# Patient Record
Sex: Male | Born: 1975 | Race: White | Hispanic: No | Marital: Single | Smoking: Current every day smoker
Health system: Southern US, Community
[De-identification: ages and names within clinical notes are randomized; demographics above are authoritative.]

## PROBLEM LIST (undated history)

## (undated) HISTORY — PX: OTHER SURGICAL HISTORY: SHX169

## (undated) HISTORY — PX: ELBOW SURGERY: SHX618

---

## 1997-08-18 ENCOUNTER — Emergency Department (HOSPITAL_COMMUNITY): Admission: EM | Admit: 1997-08-18 | Discharge: 1997-08-19 | Payer: Self-pay | Admitting: Emergency Medicine

## 1998-07-04 ENCOUNTER — Emergency Department (HOSPITAL_COMMUNITY): Admission: EM | Admit: 1998-07-04 | Discharge: 1998-07-04 | Payer: Self-pay | Admitting: Emergency Medicine

## 1999-12-06 ENCOUNTER — Emergency Department (HOSPITAL_COMMUNITY): Admission: EM | Admit: 1999-12-06 | Discharge: 1999-12-06 | Payer: Self-pay | Admitting: Emergency Medicine

## 2002-06-16 ENCOUNTER — Emergency Department (HOSPITAL_COMMUNITY): Admission: EM | Admit: 2002-06-16 | Discharge: 2002-06-16 | Payer: Self-pay | Admitting: Emergency Medicine

## 2005-02-26 ENCOUNTER — Emergency Department (HOSPITAL_COMMUNITY): Admission: EM | Admit: 2005-02-26 | Discharge: 2005-02-26 | Payer: Self-pay | Admitting: Emergency Medicine

## 2006-12-11 DIAGNOSIS — Z8709 Personal history of other diseases of the respiratory system: Secondary | ICD-10-CM | POA: Insufficient documentation

## 2010-11-18 ENCOUNTER — Emergency Department (HOSPITAL_COMMUNITY): Payer: No Typology Code available for payment source

## 2010-11-18 ENCOUNTER — Encounter (HOSPITAL_COMMUNITY): Payer: Self-pay | Admitting: Radiology

## 2010-11-18 ENCOUNTER — Emergency Department (HOSPITAL_COMMUNITY)
Admission: EM | Admit: 2010-11-18 | Discharge: 2010-11-19 | Disposition: A | Discharge status: Home / Self Care | Payer: No Typology Code available for payment source | Attending: General Surgery | Admitting: General Surgery

## 2010-11-18 DIAGNOSIS — M25529 Pain in unspecified elbow: Secondary | ICD-10-CM | POA: Insufficient documentation

## 2010-11-18 DIAGNOSIS — IMO0002 Reserved for concepts with insufficient information to code with codable children: Secondary | ICD-10-CM | POA: Insufficient documentation

## 2010-11-18 DIAGNOSIS — M549 Dorsalgia, unspecified: Secondary | ICD-10-CM | POA: Insufficient documentation

## 2010-11-18 DIAGNOSIS — M79609 Pain in unspecified limb: Secondary | ICD-10-CM | POA: Insufficient documentation

## 2010-11-18 DIAGNOSIS — S52599A Other fractures of lower end of unspecified radius, initial encounter for closed fracture: Secondary | ICD-10-CM | POA: Insufficient documentation

## 2010-11-18 DIAGNOSIS — M542 Cervicalgia: Secondary | ICD-10-CM | POA: Insufficient documentation

## 2010-11-18 DIAGNOSIS — M25569 Pain in unspecified knee: Secondary | ICD-10-CM | POA: Insufficient documentation

## 2010-11-18 DIAGNOSIS — R51 Headache: Secondary | ICD-10-CM | POA: Insufficient documentation

## 2010-11-18 DIAGNOSIS — M25539 Pain in unspecified wrist: Secondary | ICD-10-CM | POA: Insufficient documentation

## 2010-11-18 DIAGNOSIS — R5381 Other malaise: Secondary | ICD-10-CM | POA: Insufficient documentation

## 2010-11-18 LAB — POCT I-STAT, CHEM 8
BUN: 19 mg/dL (ref 6–23)
Calcium, Ion: 1.13 mmol/L (ref 1.12–1.32)
Chloride: 107 meq/L (ref 96–112)
Creatinine, Ser: 1.1 mg/dL (ref 0.50–1.35)
Glucose, Bld: 128 mg/dL — ABNORMAL HIGH (ref 70–99)
HCT: 46 % (ref 39.0–52.0)
Hemoglobin: 15.6 g/dL (ref 13.0–17.0)
Potassium: 3.7 meq/L (ref 3.5–5.1)
Sodium: 141 meq/L (ref 135–145)
TCO2: 23 mmol/L (ref 0–100)

## 2010-11-18 LAB — CBC
HCT: 43.5 % (ref 39.0–52.0)
Hemoglobin: 15.2 g/dL (ref 13.0–17.0)
MCH: 30 pg (ref 26.0–34.0)
MCHC: 34.9 g/dL (ref 30.0–36.0)
MCV: 86 fL (ref 78.0–100.0)
Platelets: 348 10*3/uL (ref 150–400)

## 2010-11-18 LAB — DIFFERENTIAL
Basophils Relative: 0 % (ref 0–1)
Eosinophils Absolute: 0.2 10*3/uL (ref 0.0–0.7)
Eosinophils Relative: 2 % (ref 0–5)
Lymphocytes Relative: 30 % (ref 12–46)
Lymphs Abs: 3.8 10*3/uL (ref 0.7–4.0)
Monocytes Absolute: 0.9 10*3/uL (ref 0.1–1.0)
Monocytes Relative: 7 % (ref 3–12)
Neutro Abs: 8 10*3/uL — ABNORMAL HIGH (ref 1.7–7.7)
Neutrophils Relative %: 62 % (ref 43–77)

## 2010-11-18 LAB — ETHANOL: Alcohol, Ethyl (B): <11 mg/dL (ref 0–11)

## 2010-11-18 MED ORDER — IOHEXOL 300 MG/ML  SOLN
80.0000 mL | Freq: Once | INTRAMUSCULAR | Status: AC | PRN
  Administered 2010-11-18: 80 mL via INTRAVENOUS

## 2011-01-19 ENCOUNTER — Telehealth: Payer: Self-pay | Admitting: Family Medicine
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
## 2011-03-28 ENCOUNTER — Ambulatory Visit: Payer: No Typology Code available for payment source | Admitting: Family Medicine

## 2011-04-02 ENCOUNTER — Encounter: Payer: Self-pay | Admitting: Family Medicine

## 2011-04-05 ENCOUNTER — Ambulatory Visit: Payer: No Typology Code available for payment source | Admitting: Family Medicine

## 2016-05-01 ENCOUNTER — Emergency Department (HOSPITAL_COMMUNITY): Payer: No Typology Code available for payment source

## 2016-05-01 ENCOUNTER — Emergency Department (HOSPITAL_COMMUNITY)
Admission: EM | Admit: 2016-05-01 | Discharge: 2016-05-01 | Disposition: A | Discharge status: Home / Self Care | Payer: No Typology Code available for payment source | Attending: Emergency Medicine | Admitting: Emergency Medicine

## 2016-05-01 ENCOUNTER — Encounter (HOSPITAL_COMMUNITY): Payer: Self-pay | Admitting: Emergency Medicine

## 2016-05-01 DIAGNOSIS — Y92410 Unspecified street and highway as the place of occurrence of the external cause: Secondary | ICD-10-CM | POA: Diagnosis not present

## 2016-05-01 DIAGNOSIS — M545 Low back pain, unspecified: Secondary | ICD-10-CM

## 2016-05-01 DIAGNOSIS — M7918 Myalgia, other site: Secondary | ICD-10-CM

## 2016-05-01 DIAGNOSIS — F17210 Nicotine dependence, cigarettes, uncomplicated: Secondary | ICD-10-CM | POA: Diagnosis not present

## 2016-05-01 DIAGNOSIS — M791 Myalgia: Secondary | ICD-10-CM | POA: Insufficient documentation

## 2016-05-01 DIAGNOSIS — Y939 Activity, unspecified: Secondary | ICD-10-CM | POA: Insufficient documentation

## 2016-05-01 DIAGNOSIS — Y999 Unspecified external cause status: Secondary | ICD-10-CM | POA: Diagnosis not present

## 2016-05-01 MED ORDER — METHOCARBAMOL 500 MG PO TABS
500.0000 mg | ORAL_TABLET | Freq: Two times a day (BID) | ORAL | 0 refills | Status: AC
Start: 2016-05-01 — End: ?

## 2016-05-01 MED ORDER — IBUPROFEN 600 MG PO TABS: 600.0000 mg | ORAL_TABLET | Freq: Four times a day (QID) | ORAL | 0 refills | Status: AC | PRN

## 2016-05-01 MED ORDER — OXYCODONE-ACETAMINOPHEN 5-325 MG PO TABS
1.0000 | ORAL_TABLET | Freq: Once | ORAL | Status: AC
  Administered 2016-05-01: 1 via ORAL
  Filled 2016-05-01: qty 1

## 2016-05-01 MED ORDER — METHOCARBAMOL 500 MG PO TABS
1000.0000 mg | ORAL_TABLET | Freq: Once | ORAL | Status: AC
  Administered 2016-05-01: 1000 mg via ORAL
  Filled 2016-05-01: qty 2

## 2016-05-01 MED ORDER — IBUPROFEN 200 MG PO TABS
600.0000 mg | ORAL_TABLET | Freq: Once | ORAL | Status: AC
  Administered 2016-05-01: 600 mg via ORAL
  Filled 2016-05-01: qty 3
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
## 2016-05-01 NOTE — ED Notes (Signed)
Pt to xray

## 2016-05-19 ENCOUNTER — Encounter (HOSPITAL_COMMUNITY): Payer: Self-pay

## 2016-05-19 ENCOUNTER — Emergency Department (HOSPITAL_COMMUNITY)
Admission: EM | Admit: 2016-05-19 | Discharge: 2016-05-19 | Disposition: A | Discharge status: Home / Self Care | Payer: No Typology Code available for payment source | Attending: Dermatology | Admitting: Dermatology

## 2016-05-19 DIAGNOSIS — M545 Low back pain: Secondary | ICD-10-CM | POA: Diagnosis not present

## 2016-05-19 DIAGNOSIS — Z5321 Procedure and treatment not carried out due to patient leaving prior to being seen by health care provider: Secondary | ICD-10-CM | POA: Insufficient documentation

## 2016-05-19 DIAGNOSIS — M549 Dorsalgia, unspecified: Secondary | ICD-10-CM | POA: Diagnosis present

## 2016-05-19 NOTE — ED Notes (Signed)
PT STS THAT THE PAIN IS GETTING BETTER, AND HE WILL FILL HIS PREVIOUS PRESCRIPTIONS FIRST, AND IF HE DOESN'T FEEL ANY BETTER, HE WILL RETURN.
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
## 2016-05-24 ENCOUNTER — Ambulatory Visit: Payer: Self-pay

## 2016-05-25 ENCOUNTER — Telehealth: Payer: Self-pay | Admitting: Family Medicine

## 2016-05-25 ENCOUNTER — Ambulatory Visit (INDEPENDENT_AMBULATORY_CARE_PROVIDER_SITE_OTHER): Payer: Self-pay | Admitting: Family Medicine

## 2016-05-25 VITALS — BP 118/80 | HR 60 | Temp 97.8°F | Resp 18 | Wt 181.2 lb

## 2016-05-25 DIAGNOSIS — G5701 Lesion of sciatic nerve, right lower limb: Secondary | ICD-10-CM

## 2016-05-25 DIAGNOSIS — M7989 Other specified soft tissue disorders: Secondary | ICD-10-CM

## 2016-05-25 DIAGNOSIS — M799 Soft tissue disorder, unspecified: Secondary | ICD-10-CM

## 2016-05-25 MED ORDER — GABAPENTIN 300 MG PO CAPS: 300.0000 mg | ORAL_CAPSULE | Freq: Two times a day (BID) | ORAL | 0 refills | Status: AC | PRN

## 2016-05-25 NOTE — Telephone Encounter (Signed)
I did not try to call him.  The resident evaluated him today, and she was gone for lunch and therefore didn't try to call him either.  Let me know if he has questions.  Thanks, JW

## 2016-05-25 NOTE — Telephone Encounter (Signed)
Pt missed a phone call from our office, asks if we can call him back asap  Please advise

## 2016-05-25 NOTE — Patient Instructions (Addendum)
Piriformis Syndrome Piriformis syndrome is a condition that can cause pain and numbness in your buttocks and down the back of your leg. Piriformis syndrome happens when the small muscle that connects the base of your spine to your hip (piriformis muscle) presses on the nerve that runs down the back of your leg (sciatic nerve). The piriformis muscle helps your hip rotate and helps to bring your leg back and out. It also helps shift your weight while you are walking to keep you stable. The sciatic nerve runs under or through the piriformis. Damage to the piriformis muscle can cause spasms that put pressure on the nerve below. This causes pain and discomfort while sitting and moving. The pain may feel as if it begins in the buttock and spreads (radiates) down your hip and thigh. What are the causes? This condition is caused by pressure on the sciatic nerve from the piriformis muscle. The piriformis muscle can get irritated with overuse, especially if other hip muscles are weak and the piriformis has to do extra work. Piriformis syndrome can also occur after an injury, like a fall onto your buttocks. What increases the risk? This condition is more likely to develop in:  Women.  People who sit for long periods of time.  Cyclists.  People who have weak buttocks muscles (gluteal muscles). What are the signs or symptoms? Pain, tingling, or numbness that starts in the buttock and runs down the back of your leg (sciatica) is the most common symptom of this condition. Your symptoms may:  Get worse the longer you sit.  Get worse when you walk, run, or go up on stairs. How is this diagnosed? This condition is diagnosed based on your symptoms, medical history, and physical exam. During this exam, your health care provider may move your leg into different positions to check for pain. He or she will also press on the muscles of your hip and buttock to see if that increases your symptoms. You may also have an  X-ray or MRI. How is this treated? Treatment for this condition may include:  Stopping all activities that cause pain or make your condition worse.  Using heat or ice to relieve pain as told by your health care provider.  Taking medicines to reduce pain and swelling.  Taking a muscle relaxer to release the piriformis muscle.  Doing range-of-motion and strengthening exercises (physical therapy) as told by your health care provider.  Massaging the affected area.  Getting an injection of an anti-inflammatory medicine or muscle relaxer to reduce inflammation and muscle tension. In rare cases, you may need surgery to cut the muscle and release pressure on the nerve if other treatments do not work. Follow these instructions at home:  Take over-the-counter and prescription medicines only as told by your health care provider.  Do not sit for long periods. Get up and walk around every 20 minutes or as often as told by your health care provider.  If directed, apply heat to the affected area as often as told by your health care provider. Use the heat source that your health care provider recommends, such as a moist heat pack or a heating pad.  Place a towel between your skin and the heat source.  Leave the heat on for 20-30 minutes.  Remove the heat if your skin turns bright red. This is especially important if you are unable to feel pain, heat, or cold. You may have a greater risk of getting burned.  If directed, apply ice  to the injured area.  Put ice in a plastic bag.  Place a towel between your skin and the bag.  Leave the ice on for 20 minutes, 2-3 times a day.  Do exercises as told by your health care provider.  Return to your normal activities as told by your health care provider. Ask your health care provider what activities are safe for you.  Keep all follow-up visits as told by your health care provider. This is important. How is this prevented?  Do not sit for longer  than 20 minutes at a time. When you sit, choose padded surfaces.  Warm up and stretch before being active.  Cool down and stretch after being active.  Give your body time to rest between periods of activity.  Make sure to use equipment that fits you.  Maintain physical fitness, including:  Strength.  Flexibility. Contact a health care provider if:  Your pain and stiffness continue or get worse.  Your leg or hip becomes weak.  You have changes in your bowel function or bladder function. This information is not intended to replace advice given to you by your health care provider. Make sure you discuss any questions you have with your health care provider. Document Released: 02/28/2005 Document Revised: 11/03/2015 Document Reviewed: 02/10/2015 Elsevier Interactive Patient Education  2017 [employer]     IF you received an x-ray today, you will receive an invoice from [facility]. Please contact [facility] at [phone] with questions or concerns regarding your invoice.   IF you received labwork today, you will receive an invoice from [facility]. Please contact [facility] at [phone] with questions or concerns regarding your invoice.   Our billing staff will not be able to assist you with questions regarding bills from these companies.  You will be contacted with the lab results as soon as they are available. The fastest way to get your results is to activate your My Chart account. Instructions are located on the last page of this paperwork. If you have not heard from us regarding the results in 2 weeks, please contact this office.

## 2016-05-25 NOTE — Telephone Encounter (Signed)
Did you try to call him? Seen today
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
## 2017-08-03 ENCOUNTER — Encounter: Payer: Self-pay | Admitting: Family Medicine
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
## 2021-02-12 ENCOUNTER — Emergency Department (HOSPITAL_COMMUNITY)
Admission: EM | Admit: 2021-02-12 | Discharge: 2021-02-12 | Disposition: A | Discharge status: Home / Self Care | Payer: BC Managed Care – PPO | Attending: Emergency Medicine | Admitting: Emergency Medicine

## 2021-02-12 ENCOUNTER — Encounter (HOSPITAL_COMMUNITY): Payer: Self-pay

## 2021-02-12 DIAGNOSIS — L509 Urticaria, unspecified: Secondary | ICD-10-CM | POA: Diagnosis present

## 2021-02-12 DIAGNOSIS — E871 Hypo-osmolality and hyponatremia: Secondary | ICD-10-CM | POA: Insufficient documentation

## 2021-02-12 DIAGNOSIS — F17290 Nicotine dependence, other tobacco product, uncomplicated: Secondary | ICD-10-CM | POA: Diagnosis not present

## 2021-02-12 DIAGNOSIS — T7840XA Allergy, unspecified, initial encounter: Secondary | ICD-10-CM

## 2021-02-12 LAB — CBC WITH DIFFERENTIAL/PLATELET
Abs Immature Granulocytes: 0.21 10*3/uL — ABNORMAL HIGH (ref 0.00–0.07)
Basophils Absolute: 0.1 10*3/uL (ref 0.0–0.1)
Basophils Relative: 0 %
Eosinophils Absolute: 0 10*3/uL (ref 0.0–0.5)
Eosinophils Relative: 0 %
HCT: 42.1 % (ref 39.0–52.0)
Hemoglobin: 14.2 g/dL (ref 13.0–17.0)
Immature Granulocytes: 1 %
Lymphocytes Relative: 5 %
Lymphs Abs: 1.3 10*3/uL (ref 0.7–4.0)
MCH: 30.1 pg (ref 26.0–34.0)
MCHC: 33.7 g/dL (ref 30.0–36.0)
MCV: 89.4 fL (ref 80.0–100.0)
Monocytes Absolute: 1.2 10*3/uL — ABNORMAL HIGH (ref 0.1–1.0)
Monocytes Relative: 5 %
Neutro Abs: 21.3 10*3/uL — ABNORMAL HIGH (ref 1.7–7.7)
Neutrophils Relative %: 89 %
Platelets: 429 10*3/uL — ABNORMAL HIGH (ref 150–400)
RBC: 4.71 MIL/uL (ref 4.22–5.81)
RDW: 11.9 % (ref 11.5–15.5)
WBC: 24 10*3/uL — ABNORMAL HIGH (ref 4.0–10.5)
nRBC: 0 % (ref 0.0–0.2)

## 2021-02-12 LAB — BASIC METABOLIC PANEL
Anion gap: 8 (ref 5–15)
BUN: 12 mg/dL (ref 6–20)
CO2: 24 mmol/L (ref 22–32)
Calcium: 8.2 mg/dL — ABNORMAL LOW (ref 8.9–10.3)
Chloride: 97 mmol/L — ABNORMAL LOW (ref 98–111)
Creatinine, Ser: 1.2 mg/dL (ref 0.61–1.24)
GFR, Estimated: >60 mL/min (ref >60)
Glucose, Bld: 127 mg/dL — ABNORMAL HIGH (ref 70–99)
Potassium: 4.3 mmol/L (ref 3.5–5.1)
Sodium: 129 mmol/L — ABNORMAL LOW (ref 135–145)

## 2021-02-12 MED ORDER — DIPHENHYDRAMINE HCL 50 MG/ML IJ SOLN
25.0000 mg | Freq: Once | INTRAMUSCULAR | Status: DC
  Filled 2021-02-12: qty 1

## 2021-02-12 MED ORDER — ALUM & MAG HYDROXIDE-SIMETH 200-200-20 MG/5ML PO SUSP
30.0000 mL | Freq: Once | ORAL | Status: AC
  Administered 2021-02-12: 30 mL via ORAL
  Filled 2021-02-12: qty 30

## 2021-02-12 MED ORDER — DIPHENHYDRAMINE HCL 25 MG PO TABS: 25.0000 mg | ORAL_TABLET | Freq: Four times a day (QID) | ORAL | 0 refills | Status: AC | PRN

## 2021-02-12 MED ORDER — FAMOTIDINE IN NACL 20-0.9 MG/50ML-% IV SOLN
20.0000 mg | Freq: Once | INTRAVENOUS | Status: AC
  Administered 2021-02-12: 20 mg via INTRAVENOUS
  Filled 2021-02-12: qty 50

## 2021-02-12 MED ORDER — METHYLPREDNISOLONE SODIUM SUCC 125 MG IJ SOLR
125.0000 mg | Freq: Once | INTRAMUSCULAR | Status: AC
  Administered 2021-02-12: 125 mg via INTRAVENOUS
  Filled 2021-02-12: qty 2

## 2021-02-12 MED ORDER — DIPHENHYDRAMINE HCL 25 MG PO CAPS
25.0000 mg | ORAL_CAPSULE | Freq: Once | ORAL | Status: AC
  Administered 2021-02-12: 25 mg via ORAL
  Filled 2021-02-12: qty 1

## 2021-02-12 MED ORDER — PREDNISONE 10 MG (21) PO TBPK: ORAL_TABLET | Freq: Every day | ORAL | 0 refills | Status: AC

## 2021-02-12 MED ORDER — DIPHENHYDRAMINE HCL 50 MG/ML IJ SOLN
25.0000 mg | Freq: Once | INTRAMUSCULAR | Status: AC
  Administered 2021-02-12: 25 mg via INTRAVENOUS
  Filled 2021-02-12: qty 1

## 2021-02-12 MED ORDER — FAMOTIDINE 20 MG PO TABS: 20.0000 mg | ORAL_TABLET | Freq: Every day | ORAL | 0 refills | Status: AC
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
## 2021-02-12 NOTE — ED Provider Notes (Signed)
[facility] [place] [facility]-EMERGENCY DEPT Provider Note   CSN: [id number] Arrival date & time: 02/12/21  4680     History Chief Complaint  Patient presents with   Allergic Reaction    Aaron Gray is a 45 y.o. male.  Pt presents to the ED today with hives all over.  Pt said that his Wellbutrin dose was increased 2 days ago and that is the day he broke out in hives.  Pt denies any sob or swelling in his mouth.  He has not taken anything for his sx.  He said the hives are extremely itchy.        History reviewed. No pertinent past medical history.  Patient Active Problem List   Diagnosis Date Noted   PNEUMONIA, HX OF 12/11/2006    Past Surgical History:  Procedure Laterality Date   ELBOW SURGERY Right    knee surgery Right        History reviewed. No pertinent family history.  Social History   Tobacco Use   Smoking status: Every Day    Types: E-cigarettes   Smokeless tobacco: Never  Substance Use Topics   Alcohol use: No   Drug use: No    Home Medications Prior to Admission medications   Medication Sig Start Date End Date Taking? Authorizing Provider  diphenhydrAMINE (BENADRYL) 25 MG tablet Take 1 tablet (25 mg total) by mouth every 6 (six) hours as needed. 02/12/21  Yes Jacalyn Lefevre, MD  famotidine (PEPCID) 20 MG tablet Take 1 tablet (20 mg total) by mouth daily. 02/12/21  Yes Jacalyn Lefevre, MD  predniSONE (STERAPRED UNI-PAK 21 TAB) 10 MG (21) TBPK tablet Take by mouth daily. Take 6 tabs by mouth daily  for 2 days, then 5 tabs for 2 days, then 4 tabs for 2 days, then 3 tabs for 2 days, 2 tabs for 2 days, then 1 tab by mouth daily for 2 days 02/12/21  Yes Jacalyn Lefevre, MD  gabapentin (NEURONTIN) 300 MG capsule Take 1 capsule (300 mg total) by mouth 2 (two) times daily as needed (nerve pain). 05/25/16   Pincus Large, DO  ibuprofen (ADVIL,MOTRIN) 600 MG tablet Take 1 tablet (600 mg total) by mouth every 6 (six) hours as needed. 05/01/16   Audry Pili, PA-C  methocarbamol (ROBAXIN) 500 MG tablet Take 1 tablet (500 mg total) by mouth 2 (two) times daily. 05/01/16   Audry Pili, PA-C    Allergies    Patient has no known allergies.  Review of Systems   Review of Systems  Skin:  Positive for rash.  All other systems reviewed and are negative.  Physical Exam Updated Vital Signs BP 110/65 (BP Location: Left Arm)   Pulse 93   Temp 99.5 F (37.5 C) (Oral)   Resp 20   Ht 5\' 10"  (1.778 m)   Wt 81.6 kg   SpO2 100%   BMI 25.83 kg/m   Physical Exam Vitals and nursing note reviewed.  Constitutional:      Appearance: Normal appearance.  HENT:     Head: Normocephalic and atraumatic.     Right Ear: External ear normal.     Left Ear: External ear normal.     Nose: Nose normal.     Mouth/Throat:     Mouth: Mucous membranes are moist.     Pharynx: Oropharynx is clear.  Eyes:     Extraocular Movements: Extraocular movements intact.     Conjunctiva/sclera: Conjunctivae normal.     Pupils: Pupils are  equal, round, and reactive to light.  Cardiovascular:     Rate and Rhythm: Normal rate and regular rhythm.     Pulses: Normal pulses.     Heart sounds: Normal heart sounds.  Pulmonary:     Effort: Pulmonary effort is normal.     Breath sounds: Normal breath sounds.  Abdominal:     General: Abdomen is flat. Bowel sounds are normal.     Palpations: Abdomen is soft.  Musculoskeletal:        General: Normal range of motion.     Cervical back: Normal range of motion and neck supple.  Skin:    Capillary Refill: Capillary refill takes less than 2 seconds.     Findings: Rash present. Rash is urticarial.     Comments: Diffuse urticaria  Neurological:     General: No focal deficit present.     Mental Status: He is alert and oriented to person, place, and time.  Psychiatric:        Mood and Affect: Mood normal.        Behavior: Behavior normal.    ED Results / Procedures / Treatments   Labs (all labs ordered are listed, but only  abnormal results are displayed) Labs Reviewed  BASIC METABOLIC PANEL - Abnormal; Notable for the following components:      Result Value   Sodium 129 (*)    Chloride 97 (*)    Glucose, Bld 127 (*)    Calcium 8.2 (*)    All other components within normal limits  CBC WITH DIFFERENTIAL/PLATELET - Abnormal; Notable for the following components:   WBC 24.0 (*)    Platelets 429 (*)    Neutro Abs 21.3 (*)    Monocytes Absolute 1.2 (*)    Abs Immature Granulocytes 0.21 (*)    All other components within normal limits    EKG None  Radiology No results found.  Procedures Procedures   Medications Ordered in ED Medications  alum & mag hydroxide-simeth (MAALOX/MYLANTA) 200-200-20 MG/5ML suspension 30 mL (has no administration in time range)  diphenhydrAMINE (BENADRYL) injection 25 mg (has no administration in time range)  methylPREDNISolone sodium succinate (SOLU-MEDROL) 125 mg/2 mL injection 125 mg (125 mg Intravenous Given 02/12/21 0935)  diphenhydrAMINE (BENADRYL) injection 25 mg (25 mg Intravenous Given 02/12/21 0935)  famotidine (PEPCID) IVPB 20 mg premix (20 mg Intravenous New Bag/Given 02/12/21 0934)    ED Course  I have reviewed the triage vital signs and the nursing notes.  Pertinent labs & imaging results that were available during my care of the patient were reviewed by me and considered in my medical decision making (see chart for details).    MDM Rules/Calculators/A&P                           Sx have improved.  Urticaria is still there, but it is significantly faded.  Elevated wbc is likely due to stress response.  He has no evidence of infection. Na is low and he will need to get this rechecked.  Pt is to return if worse.  F/u with pcp.  Final Clinical Impression(s) / ED Diagnoses Final diagnoses:  Urticaria  Allergic reaction, initial encounter  Hyponatremia    Rx / DC Orders ED Discharge Orders          Ordered    predniSONE (STERAPRED UNI-PAK 21 TAB) 10 MG  (21) TBPK tablet  Daily        02/12/21  1108    diphenhydrAMINE (BENADRYL) 25 MG tablet  Every 6 hours PRN        02/12/21 1108    famotidine (PEPCID) 20 MG tablet  Daily        02/12/21 1108             Jacalyn Lefevre, MD 02/12/21 1110

## 2021-02-12 NOTE — Discharge Instructions (Addendum)
Your sodium level was a little low today.  You will need to get this rechecked by your doctor in about a week.
